# Patient Record
Sex: Female | Born: 1935
Health system: Southern US, Community
[De-identification: ages and names within clinical notes are randomized; demographics above are authoritative.]

## PROBLEM LIST (undated history)

## (undated) DIAGNOSIS — Z558 Other problems related to education and literacy: Secondary | ICD-10-CM

## (undated) DIAGNOSIS — G459 Transient cerebral ischemic attack, unspecified: Secondary | ICD-10-CM

## (undated) DIAGNOSIS — I2089 Other forms of angina pectoris: Secondary | ICD-10-CM

---

## 2019-09-16 ENCOUNTER — Ambulatory Visit (INDEPENDENT_AMBULATORY_CARE_PROVIDER_SITE_OTHER): Payer: Medicare HMO | Admitting: Family Medicine

## 2019-09-16 ENCOUNTER — Encounter: Payer: Self-pay | Admitting: Family Medicine

## 2019-09-16 ENCOUNTER — Other Ambulatory Visit: Payer: Self-pay

## 2019-09-16 DIAGNOSIS — M5136 Other intervertebral disc degeneration, lumbar region: Secondary | ICD-10-CM | POA: Diagnosis not present

## 2019-09-16 MED ORDER — GABAPENTIN 100 MG PO CAPS: 200.0000 mg | ORAL_CAPSULE | Freq: Every day | ORAL | 0 refills | Status: DC

## 2019-09-16 MED ORDER — GABAPENTIN 100 MG PO CAPS: 200.0000 mg | ORAL_CAPSULE | Freq: Every day | ORAL | 0 refills | Status: AC

## 2019-09-16 NOTE — Assessment & Plan Note (Signed)
Degenerative disc disease, patient likely has also some mild radicular symptoms.  Started on gabapentin, home exercises given, patient wants to hold on any type of formal physical therapy secondary to the coronavirus.  We discussed the possibility though of needing advanced imaging and potential injections.  Patient would like to avoid that.  Patient will try the conservative therapy and follow-up with me again in 4 to 8 weeks

## 2019-09-16 NOTE — Progress Notes (Signed)
[name] [place] [place], [place] [address] Phone: [phone] Subjective:   [name], am serving as a scribe for Dr. Hulan Saas.  This visit occurred during the SARS-CoV-2 public health emergency.  Safety protocols were in place, including screening questions prior to the visit, additional usage of staff PPE, and extensive cleaning of exam room while observing appropriate contact time as indicated for disinfecting solutions.    CC: Low back pain  [id number]  Sheila [name] is a 85 y.o. female coming in with complaint of back pain. Patient states that she has been having back pain since last year. Pain radiates down into her right leg. Has pain in the glute, hamstring and quad. Knee also is "achy." History of knee surgery in 2020. History of total hip replacement in 2011. Had Synvisc injection in October but has not had any relief. Has been trying to not take anything for pain.  Patient likes to even avoid over-the-counter medications.    History reviewed. No pertinent past medical history. History reviewed. No pertinent surgical history. Social History   Socioeconomic History  . Marital status: Widowed    Spouse name: Not on file  . Number of children: Not on file  . Years of education: Not on file  . Highest education level: Not on file  Occupational History  . Not on file  Tobacco Use  . Smoking status: Not on file  Substance and Sexual Activity  . Alcohol use: Not on file  . Drug use: Not on file  . Sexual activity: Not on file  Other Topics Concern  . Not on file  Social History Narrative  . Not on file   Social Determinants of Health   Financial Resource Strain:   . Difficulty of Paying Living Expenses: Not on file  Food Insecurity:   . Worried About Charity fundraiser in the Last Year: Not on file  . Ran Out of Food in the Last Year: Not on file  Transportation Needs:   . Lack of Transportation (Medical): Not on  file  . Lack of Transportation (Non-Medical): Not on file  Physical Activity:   . Days of Exercise per Week: Not on file  . Minutes of Exercise per Session: Not on file  Stress:   . Feeling of Stress : Not on file  Social Connections:   . Frequency of Communication with Friends and Family: Not on file  . Frequency of Social Gatherings with Friends and Family: Not on file  . Attends Religious Services: Not on file  . Active Member of Clubs or Organizations: Not on file  . Attends Archivist Meetings: Not on file  . Marital Status: Not on file   Not on File History reviewed. No pertinent family history.      Current Outpatient Medications (Hematological):  .  clopidogrel (PLAVIX) 75 MG tablet, Take 75 mg by mouth daily.  Current Outpatient Medications (Other):  .  gabapentin (NEURONTIN) 100 MG capsule, Take 2 capsules (200 mg total) by mouth at bedtime.    Past medical history, social, surgical and family history all reviewed in electronic medical record.  No pertanent information unless stated regarding to the chief complaint.   Review of Systems:  No headache, visual changes, nausea, vomiting, diarrhea, constipation, dizziness, abdominal pain, skin rash, fevers, chills, night sweats, weight loss, swollen lymph nodes, body aches, joint swelling, muscle aches, chest pain, shortness of breath, mood changes.   Objective  Blood pressure 138/80, pulse 76, height 5\' 6"  (1.676 m), weight 164 lb (74.4 kg), SpO2 97 %.   General: No apparent distress alert and oriented x3 mood and affect normal, dressed appropriately.  HEENT: Pupils equal, extraocular movements intact  Respiratory: Patient's speak in full sentences and does not appear short of breath  Cardiovascular: No lower extremity edema, non tender, no erythema  Skin: Warm dry intact with no signs of infection or rash on extremities or on axial skeleton.  Abdomen: Soft nontender  Neuro: Cranial nerves II through XII  are intact, neurovascularly intact in all extremities with 2+ DTRs and 2+ pulses.  Lymph: No lymphadenopathy of posterior or anterior cervical chain or axillae bilaterally.  Gait antalgic MSK:  tender with full range of motion and good stability and symmetric strength and tone of shoulders, elbows, wrist, hip and ankles bilaterally.  Moderate to severe arthritic changes of multiple joints   Back exam does show the patient does have significant scoliosis with a right sided rotation and left-sided sidebending of the lumbar spine.  Loss of lordosis.  Tender to palpation in the paraspinal musculature on the right side.  Mild increase in tightness with straight leg test on the right side compared to left.  Deep tendon reflexes are intact.  Neurovascular intact distally right greater than left.  Tightness of the FABER test on the right side as well.  [id number]; 15 additional minutes spent for Therapeutic exercises as stated in above notes.  This included exercises focusing on stretching, strengthening, with significant focus on eccentric aspects.   Long term goals include an improvement in range of motion, strength, endurance as well as avoiding reinjury. Patient's frequency would include in 1-2 times a day, 3-5 times a week for a duration of 6-12 weeks. Low back exercises that included:  Pelvic tilt/bracing instruction to focus on control of the pelvic girdle and lower abdominal muscles  Glute strengthening exercises, focusing on proper firing of the glutes without engaging the low back muscles Proper stretching techniques for maximum relief for the hamstrings, hip flexors, low back and some rotation where tolerated    Proper technique shown and discussed handout in great detail with ATC.  All questions were discussed and answered.   Impression and Recommendations:     This case required medical decision making of moderate complexity. The above documentation has been reviewed and is accurate and complete  , DO       Note: This dictation was prepared with Dragon dictation along with smaller phrase technology. Any transcriptional errors that result from this process are unintentional.

## 2019-09-16 NOTE — Patient Instructions (Signed)
Good to see you.  Ice 20 minutes 2 times daily. Usually after activity and before bed. Exercises 3 times a week.  Gabapentin 200 mg at night   Turmeric 500mg  daily  Tart cherry extract 1200mg  at night Vitamin D 2000 IU daily  See me again in 4-6 weeks   [address], [address] [place], [place] [address] Phone [phone]

## 2019-10-15 ENCOUNTER — Ambulatory Visit (INDEPENDENT_AMBULATORY_CARE_PROVIDER_SITE_OTHER): Payer: Medicare HMO

## 2019-10-15 ENCOUNTER — Other Ambulatory Visit: Payer: Self-pay

## 2019-10-15 ENCOUNTER — Ambulatory Visit: Payer: Medicare HMO | Admitting: Family Medicine

## 2019-10-15 ENCOUNTER — Encounter: Payer: Self-pay | Admitting: Family Medicine

## 2019-10-15 VITALS — BP 110/70 | HR 77 | Ht 66.0 in | Wt 163.0 lb

## 2019-10-15 DIAGNOSIS — M25551 Pain in right hip: Secondary | ICD-10-CM | POA: Diagnosis not present

## 2019-10-15 DIAGNOSIS — M5136 Other intervertebral disc degeneration, lumbar region: Secondary | ICD-10-CM

## 2019-10-15 DIAGNOSIS — M7061 Trochanteric bursitis, right hip: Secondary | ICD-10-CM | POA: Diagnosis not present

## 2019-10-15 DIAGNOSIS — G8929 Other chronic pain: Secondary | ICD-10-CM | POA: Diagnosis not present

## 2019-10-15 NOTE — Assessment & Plan Note (Signed)
Patient is making improvement with this.  Attempted doing greater trochanteric injection.  Stayed significantly superficial secondary to patient having a arthroplasty.  Discussed with patient about icing regimen, avoiding direct impact in the area.  I believe the patient's most recent x-rays of the hip seem to be stable but we did discuss the possibility of getting another x-ray to make sure.  Did not feel any instability noted today.  Patient does have severe arthritic changes of the right knee that is likely contributing to some of the discomfort and pain.  Patient will follow up with me again in 4 to 6 weeks.

## 2019-10-15 NOTE — Progress Notes (Signed)
[name] [address] [place] [address] Phone: [phone] Subjective:   I Sheila [name] am serving as a Neurosurgeon for Dr. Antoine Primas.  This visit occurred during the SARS-CoV-2 public health emergency.  Safety protocols were in place, including screening questions prior to the visit, additional usage of staff PPE, and extensive cleaning of exam room while observing appropriate contact time as indicated for disinfecting solutions.    CC: Low back pain and hip pain  [id number]  Sheila [name] is a 86 y.o. female coming in with complaint of back and right hip pain. States she is doing ok today.  Patient states that unfortunately now seems to be more of the right leg pain.  Patient has a history of a right hip replacement.  States that it seems to be on the lateral aspect of the hip.  Very severe.  Wakes her up at night.  Stays localized with some intermittent radiation down the thigh.  No weakness but sometimes the pain is severe when she goes from a sitting to standing position that it takes some time to increase activity.  Once she starts walking it seems to get better.  Does state that the back pain seems to be 80 to 90% better.     No past medical history on file. No past surgical history on file. Social History   Socioeconomic History  . Marital status: Widowed    Spouse name: Not on file  . Number of children: Not on file  . Years of education: Not on file  . Highest education level: Not on file  Occupational History  . Not on file  Tobacco Use  . Smoking status: Not on file  Substance and Sexual Activity  . Alcohol use: Not on file  . Drug use: Not on file  . Sexual activity: Not on file  Other Topics Concern  . Not on file  Social History Narrative  . Not on file   Social Determinants of Health   Financial Resource Strain:   . Difficulty of Paying Living Expenses: Not on file  Food Insecurity:   . Worried About Patent examiner in the Last Year: Not on file  . Ran Out of Food in the Last Year: Not on file  Transportation Needs:   . Lack of Transportation (Medical): Not on file  . Lack of Transportation (Non-Medical): Not on file  Physical Activity:   . Days of Exercise per Week: Not on file  . Minutes of Exercise per Session: Not on file  Stress:   . Feeling of Stress : Not on file  Social Connections:   . Frequency of Communication with Friends and Family: Not on file  . Frequency of Social Gatherings with Friends and Family: Not on file  . Attends Religious Services: Not on file  . Active Member of Clubs or Organizations: Not on file  . Attends Banker Meetings: Not on file  . Marital Status: Not on file   Not on File No family history on file.      Current Outpatient Medications (Hematological):  .  clopidogrel (PLAVIX) 75 MG tablet, Take 75 mg by mouth daily.  Current Outpatient Medications (Other):  .  gabapentin (NEURONTIN) 100 MG capsule, Take 2 capsules (200 mg total) by mouth at bedtime.    Past medical history, social, surgical and family history all reviewed in electronic medical record.  No pertanent information unless stated regarding to the chief  complaint.   Review of Systems:  No headache, visual changes, nausea, vomiting, diarrhea, constipation, dizziness, abdominal pain, skin rash, fevers, chills, night sweats, weight loss, swollen lymph nodes,  chest pain, shortness of breath, mood changes.  Positive muscle aches, body aches,  Objective  Blood pressure 110/70, pulse 77, height 5\' 6"  (1.676 m), weight 163 lb (73.9 kg), SpO2 97 %.    General: No apparent distress alert and oriented x3 mood and affect normal, dressed appropriately.  HEENT: Pupils equal, extraocular movements intact  Respiratory: Patient's speak in full sentences and does not appear short of breath  Cardiovascular: Trace lower extremity edema, tender, no erythema  Skin: Warm dry intact with  no signs of infection or rash on extremities or on axial skeleton.   Gait antalgic favoring right leg MSK: Arthritic changes of multiple joints  Right hip exam does not have any instability.  Patient on inspection incision from previous surgery is well-healed.  Patient is severely tender to palpation over the lateral greater trochanteric area.  Unable to do Lawrence secondary to pain and tightness.  Negative straight leg test but still has tightness of the hamstring.  After verbal consent patient was prepped with alcohol swab and with a 21-gauge 2 inch needle injected with 1 cc of 0.5% Marcaine and 1 cc of Kenalog 40 mg/mL into the right greater trochanteric area.  No blood loss.  Stayed superficial.  Band-Aid placed.  Postinjection instructions given    Impression and Recommendations:     This case required medical decision making of moderate complexity. The above documentation has been reviewed and is accurate and complete Lyndal Pulley, DO       Note: This dictation was prepared with Dragon dictation along with smaller phrase technology. Any transcriptional errors that result from this process are unintentional.

## 2019-10-15 NOTE — Patient Instructions (Addendum)
Xray today Don't do the exercise that hurts Continue everything else See me again in 4-6 weeks

## 2019-11-12 ENCOUNTER — Other Ambulatory Visit: Payer: Self-pay

## 2019-11-12 ENCOUNTER — Ambulatory Visit (INDEPENDENT_AMBULATORY_CARE_PROVIDER_SITE_OTHER): Payer: Medicare HMO | Admitting: Family Medicine

## 2019-11-12 ENCOUNTER — Encounter: Payer: Self-pay | Admitting: Family Medicine

## 2019-11-12 DIAGNOSIS — M5136 Other intervertebral disc degeneration, lumbar region: Secondary | ICD-10-CM | POA: Diagnosis not present

## 2019-11-12 DIAGNOSIS — M7061 Trochanteric bursitis, right hip: Secondary | ICD-10-CM

## 2019-11-12 NOTE — Assessment & Plan Note (Signed)
Significant improvement at this time.  We will have patient follow-up again in 6 weeks.  Continue the gabapentin.  If doing well in 6 weeks follow-up as needed

## 2019-11-12 NOTE — Progress Notes (Signed)
[place] Phone: [phone] Subjective:   [name], am serving as a scribe for Dr. Hulan Saas. This visit occurred during the SARS-CoV-2 public health emergency.  Safety protocols were in place, including screening questions prior to the visit, additional usage of staff PPE, and extensive cleaning of exam room while observing appropriate contact time as indicated for disinfecting solutions.   I'm seeing this patient by the request  of:  System, Pcp Not In  CC: Hip and low back follow-up  [id number]   10/12/2019 Patient is making improvement with this.  Attempted doing greater trochanteric injection.  Stayed significantly superficial secondary to patient having a arthroplasty.  Discussed with patient about icing regimen, avoiding direct impact in the area.  I believe the patient's most recent x-rays of the hip seem to be stable but we did discuss the possibility of getting another x-ray to make sure.  Did not feel any instability noted today.  Patient does have severe arthritic changes of the right knee that is likely contributing to some of the discomfort and pain.  Patient will follow up with me again in 4 to 6 weeks.  Updat 11/12/2019 Sheila [name] is a 86 y.o. female coming in with complaint of right hip pain. Patient states that her pain subsided since the injection. Has been having slight lower back pain but is better as well.  Patient states that the gabapentin has helped her sleep and not having as much radicular symptoms either.  Feels like she has made significant progress at the moment.  Patient cannot believe how much this did help.     No past medical history on file. No past surgical history on file. Social History   Socioeconomic History  . Marital status: Widowed    Spouse name: Not on file  . Number of children: Not on file  . Years of education: Not on file  . Highest education level: Not  on file  Occupational History  . Not on file  Tobacco Use  . Smoking status: Not on file  Substance and Sexual Activity  . Alcohol use: Not on file  . Drug use: Not on file  . Sexual activity: Not on file  Other Topics Concern  . Not on file  Social History Narrative  . Not on file   Social Determinants of Health   Financial Resource Strain:   . Difficulty of Paying Living Expenses: Not on file  Food Insecurity:   . Worried About Charity fundraiser in the Last Year: Not on file  . Ran Out of Food in the Last Year: Not on file  Transportation Needs:   . Lack of Transportation (Medical): Not on file  . Lack of Transportation (Non-Medical): Not on file  Physical Activity:   . Days of Exercise per Week: Not on file  . Minutes of Exercise per Session: Not on file  Stress:   . Feeling of Stress : Not on file  Social Connections:   . Frequency of Communication with Friends and Family: Not on file  . Frequency of Social Gatherings with Friends and Family: Not on file  . Attends Religious Services: Not on file  . Active Member of Clubs or Organizations: Not on file  . Attends Archivist Meetings: Not on file  . Marital Status: Not on file   Not on File No family history on file.      Current Outpatient  Medications (Hematological):  .  clopidogrel (PLAVIX) 75 MG tablet, Take 75 mg by mouth daily.  Current Outpatient Medications (Other):  .  gabapentin (NEURONTIN) 100 MG capsule, Take 2 capsules (200 mg total) by mouth at bedtime.   Reviewed prior external information including notes and imaging from  primary care provider As well as notes that were available from care everywhere and other healthcare systems.  Past medical history, social, surgical and family history all reviewed in electronic medical record.  No pertanent information unless stated regarding to the chief complaint.   Review of Systems:  No headache, visual changes, nausea, vomiting, diarrhea,  constipation, dizziness, abdominal pain, skin rash, fevers, chills, night sweats, weight loss, swollen lymph nodes, body aches, joint swelling, chest pain, shortness of breath, mood changes. POSITIVE muscle aches  Objective  Blood pressure 114/82, pulse 74, height 5\' 6"  (1.676 m), SpO2 98 %.   General: No apparent distress alert and oriented x3 mood and affect normal, dressed appropriately.  HEENT: Pupils equal, extraocular movements intact  Respiratory: Patient's speak in full sentences and does not appear short of breath  Cardiovascular: Trace lower extremity edema, non tender, no erythema  Skin: Warm dry intact with no signs of infection or rash on extremities or on axial skeleton.  Abdomen: Soft nontender  Neuro: Cranial nerves II through XII are intact, neurovascularly intact in all extremities with 2+ DTRs and 2+ pulses.  Lymph: No lymphadenopathy of posterior or anterior cervical chain or axillae bilaterally.  Gait antalgic walk with the aid of a cane MSK: Right hip no longer tender to palpation over the greater trochanteric area.  Still have the loss of lordosis and degenerative scoliosis of the lumbar spine significant arthritic changes of multiple joints    Impression and Recommendations:      The above documentation has been reviewed and is accurate and complete , DO       Note: This dictation was prepared with Dragon dictation along with smaller phrase technology. Any transcriptional errors that result from this process are unintentional.

## 2019-11-12 NOTE — Assessment & Plan Note (Signed)
Continue gabapentin, no need for further imaging at this time

## 2019-11-12 NOTE — Patient Instructions (Signed)
See me in 6 weeks Can do another injection on hip if needed

## 2019-12-24 ENCOUNTER — Other Ambulatory Visit: Payer: Self-pay

## 2019-12-24 ENCOUNTER — Encounter: Payer: Self-pay | Admitting: Family Medicine

## 2019-12-24 ENCOUNTER — Ambulatory Visit: Payer: Medicare HMO | Admitting: Family Medicine

## 2019-12-24 VITALS — BP 122/82 | HR 76 | Ht 66.0 in | Wt 163.0 lb

## 2019-12-24 DIAGNOSIS — M2550 Pain in unspecified joint: Secondary | ICD-10-CM

## 2019-12-24 DIAGNOSIS — M5136 Other intervertebral disc degeneration, lumbar region: Secondary | ICD-10-CM

## 2019-12-24 MED ORDER — KETOROLAC TROMETHAMINE 30 MG/ML IJ SOLN
30.0000 mg | Freq: Once | INTRAMUSCULAR | Status: AC
  Administered 2019-12-24: 30 mg via INTRAMUSCULAR

## 2019-12-24 MED ORDER — METHYLPREDNISOLONE ACETATE 40 MG/ML IJ SUSP
40.0000 mg | Freq: Once | INTRAMUSCULAR | Status: AC
  Administered 2019-12-24: 40 mg via INTRAMUSCULAR

## 2019-12-24 NOTE — Patient Instructions (Addendum)
See me in 6 weeks

## 2019-12-24 NOTE — Assessment & Plan Note (Signed)
Known severe degenerative disc disease with some lumbar radiculopathy.  Toradol and Depo-Medrol given today that I think will be beneficial for some of the aches and pains.  Patient will follow up with her pain management provider to try to get an epidural which patient has responded to.  We will continue to see patient for the possibility of the greater trochanteric.  Social determinants of health include difficulty with transportation and can only come when patient has transportation provided for her.  Chronic problem mild exacerbation

## 2019-12-24 NOTE — Progress Notes (Signed)
[place] Phone: [phone] Subjective:   Sheila [name], am serving as a scribe for Dr. Hulan Saas. This visit occurred during the SARS-CoV-2 public health emergency.  Safety protocols were in place, including screening questions prior to the visit, additional usage of staff PPE, and extensive cleaning of exam room while observing appropriate contact time as indicated for disinfecting solutions.    I'm seeing this patient by the request  of:  System, Pcp Not In  CC: Back pain and hip pain follow-up  [id number]   11/12/2019 Significant improvement at this time.  We will have patient follow-up again in 6 weeks.  Continue the gabapentin.  If doing well in 6 weeks follow-up as needed  Continue gabapentin, [name] need for further imaging at this time  Update 12/24/2019 Sheila [name] is a 86 y.o. female coming in with complaint of right hip and back pain. Patient states that her back pain increased when she went off of gabapentin. Patient had epidural September 2020. Radiculopathy coming back in right leg.  Patient has known degenerative disc disease.  Patient feels like she needs an epidural done.  Feels like the hip is actually doing relatively well at the moment.  Does not feel like she needs an injection at the moment.      [name] past medical history on file. [name] past surgical history on file. Social History   Socioeconomic History  . Marital status: Widowed    Spouse name: Not on file  . Number of children: Not on file  . Years of education: Not on file  . Highest education level: Not on file  Occupational History  . Not on file  Tobacco Use  . Smoking status: Not on file  Substance and Sexual Activity  . Alcohol use: Not on file  . Drug use: Not on file  . Sexual activity: Not on file  Other Topics Concern  . Not on file  Social History Narrative  . Not on file   Social Determinants of Health    Financial Resource Strain:   . Difficulty of Paying Living Expenses:   Food Insecurity:   . Worried About Charity fundraiser in the Last Year:   . Arboriculturist in the Last Year:   Transportation Needs:   . Film/video editor (Medical):   Marland Kitchen Lack of Transportation (Non-Medical):   Physical Activity:   . Days of Exercise per Week:   . Minutes of Exercise per Session:   Stress:   . Feeling of Stress :   Social Connections:   . Frequency of Communication with Friends and Family:   . Frequency of Social Gatherings with Friends and Family:   . Attends Religious Services:   . Active Member of Clubs or Organizations:   . Attends Archivist Meetings:   Marland Kitchen Marital Status:    Allergies  Allergen Reactions  . Latex   . Penicillins Other (See Comments)  . Statins   . Clindamycin Rash  . Other Dermatitis and Other (See Comments)        [name] family history on file.      Current Outpatient Medications (Hematological):  .  clopidogrel (PLAVIX) 75 MG tablet, Take 75 mg by mouth daily.  Current Outpatient Medications (Other):  .  gabapentin (NEURONTIN) 100 MG capsule, Take 2 capsules (200 mg total) by mouth at bedtime.   Reviewed prior external information including notes and imaging  from  primary care provider As well as notes that were available from care everywhere and other healthcare systems.  Past medical history, social, surgical and family history all reviewed in electronic medical record.  [name] pertanent information unless stated regarding to the chief complaint.   Review of Systems:  [name] headache, visual changes, nausea, vomiting, diarrhea, constipation, dizziness, abdominal pain, skin rash, fevers, chills, night sweats, weight loss, swollen lymph nodes, body aches, joint swelling, chest pain, shortness of breath, mood changes. POSITIVE muscle aches  Objective  Blood pressure 122/82, pulse 76, height 5\' 6"  (1.676 m), weight 163 lb (73.9 kg), SpO2 98 %.    General: [name] apparent distress alert and oriented x3 mood and affect normal, dressed appropriately.  HEENT: Pupils equal, extraocular movements intact  Respiratory: Patient's speak in full sentences and does not appear short of breath  Cardiovascular: Trace lower extremity edema, non tender, [name] erythema  Gait antalgic walking with the aid of a cane MSK:   Low back exam does show some degenerative scoliosis noted.  Tender to palpation of the paraspinal musculature lumbar spine right greater than left.  Patient has significant tightness with straight leg test bilaterally left greater than right.  Patient does still have some tenderness over the greater trochanteric area.   Impression and Recommendations:     The above documentation has been reviewed and is accurate and complete , DO       Note: This dictation was prepared with Dragon dictation along with smaller phrase technology. Any transcriptional errors that result from this process are unintentional.

## 2020-02-04 ENCOUNTER — Ambulatory Visit: Payer: Medicare HMO | Admitting: Family Medicine

## 2020-02-04 ENCOUNTER — Other Ambulatory Visit: Payer: Self-pay

## 2020-02-04 ENCOUNTER — Encounter: Payer: Self-pay | Admitting: Family Medicine

## 2020-02-04 VITALS — BP 116/70 | HR 77 | Ht 66.0 in | Wt 155.0 lb

## 2020-02-04 DIAGNOSIS — M47818 Spondylosis without myelopathy or radiculopathy, sacral and sacrococcygeal region: Secondary | ICD-10-CM

## 2020-02-04 DIAGNOSIS — M7061 Trochanteric bursitis, right hip: Secondary | ICD-10-CM

## 2020-02-04 DIAGNOSIS — M5136 Other intervertebral disc degeneration, lumbar region: Secondary | ICD-10-CM | POA: Diagnosis not present

## 2020-02-04 NOTE — Patient Instructions (Signed)
Injected

## 2020-02-04 NOTE — Assessment & Plan Note (Signed)
Patient's continues to have chronic pain.  Seeing a pain management doctor for epidurals in the back and patient's degenerative disc disease.  Encourage patient to continue the gabapentin.  I attempted a sacroiliac injection today to see if this will get any benefit in helping [place] diagnostically and potentially therapeutically then.  Patient's best success so far has been a greater trochanteric injection and will consider this.  Follow-up again at that time which will be in 6 to 8 weeks

## 2020-02-04 NOTE — Progress Notes (Signed)
[name] [address] [place] [address] Phone: [phone] Subjective:   Sheila [name], am serving as a scribe for Dr. Antoine Primas. This visit occurred during the SARS-CoV-2 public health emergency.  Safety protocols were in place, including screening questions prior to the visit, additional usage of staff PPE, and extensive cleaning of exam room while observing appropriate contact time as indicated for disinfecting solutions.   I'm seeing this patient by the request  of:  System, Pcp Not In  CC: Back pain follow-up  GNO:[id number]   12/24/2019 Known severe degenerative disc disease with some lumbar radiculopathy.  Toradol and Depo-Medrol given today that I think will be beneficial for some of the aches and pains.  Patient will follow up with her pain management provider to try to get an epidural which patient has responded to.  We will continue to see patient for the possibility of the greater trochanteric.  Social determinants of health include difficulty with transportation and can only come when patient has transportation provided for her.  Chronic problem mild exacerbation  Update 02/04/2020 Angellina Ferdinand is a 86 y.o. female coming in with complaint of right hip pain. Pain with movement. Using IBU prn for pain. Denies any radiating symptoms.  Patient states the pain is more the posterior aspect of the back that is contributing to more the problem.  Has responded well to a greater trochanteric injection previously.  Patient was to get an epidural since we have seen patient but was unable to do so secondary to getting her Covid injection.     No past medical history on file. No past surgical history on file. Social History   Socioeconomic History  . Marital status: Widowed    Spouse name: Not on file  . Number of children: Not on file  . Years of education: Not on file  . Highest education level: Not on file  Occupational History  . Not on  file  Tobacco Use  . Smoking status: Not on file  Substance and Sexual Activity  . Alcohol use: Not on file  . Drug use: Not on file  . Sexual activity: Not on file  Other Topics Concern  . Not on file  Social History Narrative  . Not on file   Social Determinants of Health   Financial Resource Strain:   . Difficulty of Paying Living Expenses:   Food Insecurity:   . Worried About Programme researcher, broadcasting/film/video in the Last Year:   . Barista in the Last Year:   Transportation Needs:   . Freight forwarder (Medical):   Marland Kitchen Lack of Transportation (Non-Medical):   Physical Activity:   . Days of Exercise per Week:   . Minutes of Exercise per Session:   Stress:   . Feeling of Stress :   Social Connections:   . Frequency of Communication with Friends and Family:   . Frequency of Social Gatherings with Friends and Family:   . Attends Religious Services:   . Active Member of Clubs or Organizations:   . Attends Banker Meetings:   Marland Kitchen Marital Status:    Allergies  Allergen Reactions  . Latex   . Penicillins Other (See Comments)  . Statins   . Clindamycin Rash  . Other Dermatitis and Other (See Comments)        No family history on file.      Current Outpatient Medications (Hematological):  .  clopidogrel (PLAVIX)  75 MG tablet, Take 75 mg by mouth daily.  Current Outpatient Medications (Other):  .  gabapentin (NEURONTIN) 100 MG capsule, Take 2 capsules (200 mg total) by mouth at bedtime.   Reviewed prior external information including notes and imaging from  primary care provider As well as notes that were available from care everywhere and other healthcare systems.  Past medical history, social, surgical and family history all reviewed in electronic medical record.  No pertanent information unless stated regarding to the chief complaint.   Review of Systems:  No headache, visual changes, nausea, vomiting, diarrhea, constipation, dizziness, abdominal  pain, skin rash, fevers, chills, night sweats, weight loss, swollen lymph nodes, body aches, joint swelling, chest pain, shortness of breath, mood changes. POSITIVE muscle aches  Objective  Blood pressure 116/70, pulse 77, height 5\' 6"  (1.676 m), weight 155 lb (70.3 kg), SpO2 97 %.   General: No apparent distress alert and oriented x3 mood and affect normal, dressed appropriately.  HEENT: Pupils equal, extraocular movements intact  Respiratory: Patient's speak in full sentences and does not appear short of breath   antalgic gait noted. Patient does have severe kyphosis of the upper back but patient does have scoliosis of the thoracic or lumbar in the lumbosacral area.  Severe tenderness to palpation over the sacroiliac joint.  Continued tenderness over the greater trochanteric area on the right hip.  Patient does have tightness with straight leg test.  After verbal consent patient was prepped with alcohol swab and with a 21-gauge 2 inch needle injected into the right sacroiliac joint.  A total of 0.5 cc of 0.5% Marcaine and 1 cc of Kenalog 40 mg/mL used, no blood loss.  Band-Aid placed.  Postinjection instructions given    Impression and Recommendations:     This case required medical decision making of moderate complexity. The above documentation has been reviewed and is accurate and complete Lyndal Pulley, DO       Note: This dictation was prepared with Dragon dictation along with smaller phrase technology. Any transcriptional errors that result from this process are unintentional.

## 2020-03-17 ENCOUNTER — Ambulatory Visit (INDEPENDENT_AMBULATORY_CARE_PROVIDER_SITE_OTHER): Payer: Medicare HMO | Admitting: Family Medicine

## 2020-03-17 ENCOUNTER — Encounter: Payer: Self-pay | Admitting: Family Medicine

## 2020-03-17 ENCOUNTER — Other Ambulatory Visit: Payer: Self-pay

## 2020-03-17 DIAGNOSIS — M5136 Other intervertebral disc degeneration, lumbar region: Secondary | ICD-10-CM

## 2020-03-17 DIAGNOSIS — M47818 Spondylosis without myelopathy or radiculopathy, sacral and sacrococcygeal region: Secondary | ICD-10-CM | POA: Diagnosis not present

## 2020-03-17 DIAGNOSIS — M7061 Trochanteric bursitis, right hip: Secondary | ICD-10-CM | POA: Diagnosis not present

## 2020-03-17 NOTE — Assessment & Plan Note (Signed)
Chronic as well.  Not responding as well to the gabapentin secondary to age and other comorbidities care to unfortunately increase too much of medications.  Patient has been getting epidurals from a pain management center and will consider to have that done again if necessary.

## 2020-03-17 NOTE — Progress Notes (Signed)
[facility] [place] Phone: [phone] Subjective:   Sheila Sheila [name], am serving as a scribe for Dr. Hulan Saas. This visit occurred during the SARS-CoV-2 public health emergency.  Safety protocols were in place, including screening questions prior to the visit, additional usage of staff PPE, and extensive cleaning of exam room while observing appropriate contact time as indicated for disinfecting solutions.   I'm seeing this patient by the request  of:  System, Pcp Not In  CC: Back pain, hip pain follow-up  RDE:[id number]   02/04/2020 Patient's continues to have chronic pain.  Seeing a pain management doctor for epidurals in the back and patient's degenerative disc disease.  Encourage patient to continue the gabapentin.  I attempted a sacroiliac injection today to see if this will get any benefit in helping [place] diagnostically and potentially therapeutically then.  Patient's best success so far has been a greater trochanteric injection and will consider this.  Follow-up again at that time which will be in 6 to 8 weeks  Update 03/17/2020 Sheila Sheila [name] is a 86 y.o. female coming in with complaint of right hip and lower back pain. Back pain has improved but her right hip pain has started to develop pain over GT. Using gabapentin 200mg .  Patient did respond very well to a sacroiliac joint injection previously.  Pain is now more on the lateral aspect of the hip.  Still walking with the aid of a cane.     Sheila [name] past medical history on file. Sheila [name] past surgical history on file. Social History   Socioeconomic History  . Marital status: Widowed    Spouse name: Not on file  . Number of children: Not on file  . Years of education: Not on file  . Highest education level: Not on file  Occupational History  . Not on file  Tobacco Use  . Smoking status: Not on file  Substance and Sexual Activity  . Alcohol use: Not on file  . Drug use: Not  on file  . Sexual activity: Not on file  Other Topics Concern  . Not on file  Social History Narrative  . Not on file   Social Determinants of Health   Financial Resource Strain:   . Difficulty of Paying Living Expenses:   Food Insecurity:   . Worried About Charity fundraiser in the Last Year:   . Arboriculturist in the Last Year:   Transportation Needs:   . Film/video editor (Medical):   Marland Kitchen Lack of Transportation (Non-Medical):   Physical Activity:   . Days of Exercise per Week:   . Minutes of Exercise per Session:   Stress:   . Feeling of Stress :   Social Connections:   . Frequency of Communication with Friends and Family:   . Frequency of Social Gatherings with Friends and Family:   . Attends Religious Services:   . Active Member of Clubs or Organizations:   . Attends Archivist Meetings:   Marland Kitchen Marital Status:    Allergies  Allergen Reactions  . Latex   . Penicillins Other (See Comments)  . Statins   . Clindamycin Rash  . Other Dermatitis and Other (See Comments)        Sheila [name] family history on file.      Current Outpatient Medications (Hematological):  .  clopidogrel (PLAVIX) 75 MG tablet, Take 75 mg by mouth daily.  Current Outpatient Medications (Other):  .  gabapentin (NEURONTIN) 100 MG capsule, Take 2 capsules (200 mg total) by mouth at bedtime.   Reviewed prior external information including notes and imaging from  primary care provider As well as notes that were available from care everywhere and other healthcare systems.  Past medical history, social, surgical and family history all reviewed in electronic medical record.  Sheila [name] pertanent information unless stated regarding to the chief complaint.   Review of Systems:  Sheila [name] headache, visual changes, nausea, vomiting, diarrhea, constipation, dizziness, abdominal pain, skin rash, fevers, chills, night sweats, weight loss, swollen lymph nodes, body aches, joint swelling, chest pain, shortness of  breath, mood changes. POSITIVE muscle aches  Objective  Blood pressure 112/70, pulse 78, height 5\' 6"  (1.676 m), SpO2 98 %.   General: Sheila [name] apparent distress alert and oriented x3 mood and affect normal, dressed appropriately.  HEENT: Pupils equal, extraocular movements intact  Respiratory: Patient's speak in full sentences and does not appear short of breath  Cardiovascular: Sheila [name] lower extremity edema, non tender, Sheila [name] erythema  Neuro: Cranial nerves II through XII are intact, neurovascularly intact in all extremities with 2+ DTRs and 2+ pulses.  Gait antalgic with a cane Back exam shows severe scoliosis noted.  Back exam diffuse tenderness in the paraspinal musculature of the lumbar spine.  Atrophy of the musculature of the legs bilaterally.  4-5 strength of the lower extremities.  Severely tender over the right greater trochanteric area.   After verbal consent patient was prepped with alcohol swab and with a 21-gauge 2 inch needle injected into the right greater trochanteric area with a total of 2 cc of 0.5% Marcaine and 1 cc of Kenalog 40 mg/mL.  Sheila [name] blood loss.  Band-Aid placed.  Postinjection instructions given   Impression and Recommendations:     The above documentation has been reviewed and is accurate and complete , DO       Note: This dictation was prepared with Dragon dictation along with smaller phrase technology. Any transcriptional errors that result from this process are unintentional.

## 2020-03-17 NOTE — Assessment & Plan Note (Signed)
Repeat injection given today, tolerated the procedure well, discussed icing regimen and home exercise, which activities to do which wants to avoid.  Patient hopefully will respond somewhat to the injection and possibly consider the possibility of repeating the right sacroiliac joint injection at next follow-up in 6 to 8 weeks

## 2020-03-17 NOTE — Patient Instructions (Signed)
See me in 6-7 weeks 

## 2020-03-17 NOTE — Assessment & Plan Note (Signed)
Chronic problem.  We will need to monitor.  If this does make improvement we will continue with injections.

## 2020-04-28 ENCOUNTER — Ambulatory Visit: Payer: Medicare HMO | Admitting: Family Medicine

## 2020-04-28 ENCOUNTER — Other Ambulatory Visit: Payer: Self-pay

## 2020-04-28 ENCOUNTER — Encounter: Payer: Self-pay | Admitting: Family Medicine

## 2020-04-28 VITALS — BP 130/80 | HR 101 | Ht 66.0 in | Wt 150.0 lb

## 2020-04-28 DIAGNOSIS — M47818 Spondylosis without myelopathy or radiculopathy, sacral and sacrococcygeal region: Secondary | ICD-10-CM | POA: Diagnosis not present

## 2020-04-28 DIAGNOSIS — M5136 Other intervertebral disc degeneration, lumbar region: Secondary | ICD-10-CM

## 2020-04-28 MED ORDER — GABAPENTIN 100 MG PO CAPS: 200.0000 mg | ORAL_CAPSULE | Freq: Every day | ORAL | 0 refills | Status: DC

## 2020-04-28 NOTE — Progress Notes (Signed)
[name] [address] [place] [address] Phone: [phone] Subjective:   I [name] am serving as a Neurosurgeon for Dr. Antoine Primas.  This visit occurred during the SARS-CoV-2 public health emergency.  Safety protocols were in place, including screening questions prior to the visit, additional usage of staff PPE, and extensive cleaning of exam room while observing appropriate contact time as indicated for disinfecting solutions.   I'm seeing this patient by the request  of:  System, Pcp Not In  CC: Right hip pain, back pain CBS:[id number]   03/17/2020 Repeat injection given today, tolerated the procedure well, discussed icing regimen and home exercise, which activities to do which wants to avoid.  Patient hopefully will respond somewhat to the injection and possibly consider the possibility of repeating the right sacroiliac joint injection at next follow-up in 6 to 8 weeks  04/28/2020 Sheila [name] is a 86 y.o. female coming in with complaint of right hip pain. Patient states she is having pain in her hip. Believes she may need an injection. Would like a refill of gabapentin.  Patient has responded previously.  Is been very active.  Describes the pain as a dull, throbbing aching sensation.  Patient states some very mild radiation down the line but not as severe as what it has been in the past but is starting to get much worse than where she has been recently     No past medical history on file. No past surgical history on file. Social History   Socioeconomic History  . Marital status: Widowed    Spouse name: Not on file  . Number of children: Not on file  . Years of education: Not on file  . Highest education level: Not on file  Occupational History  . Not on file  Tobacco Use  . Smoking status: Not on file  Substance and Sexual Activity  . Alcohol use: Not on file  . Drug use: Not on file  . Sexual activity: Not on file  Other Topics  Concern  . Not on file  Social History Narrative  . Not on file   Social Determinants of Health   Financial Resource Strain:   . Difficulty of Paying Living Expenses:   Food Insecurity:   . Worried About Programme researcher, broadcasting/film/video in the Last Year:   . Barista in the Last Year:   Transportation Needs:   . Freight forwarder (Medical):   Marland Kitchen Lack of Transportation (Non-Medical):   Physical Activity:   . Days of Exercise per Week:   . Minutes of Exercise per Session:   Stress:   . Feeling of Stress :   Social Connections:   . Frequency of Communication with Friends and Family:   . Frequency of Social Gatherings with Friends and Family:   . Attends Religious Services:   . Active Member of Clubs or Organizations:   . Attends Banker Meetings:   Marland Kitchen Marital Status:    Allergies  Allergen Reactions  . Latex   . Penicillins Other (See Comments)  . Statins   . Clindamycin Rash  . Other Dermatitis and Other (See Comments)        No family history on file.      Current Outpatient Medications (Hematological):  .  clopidogrel (PLAVIX) 75 MG tablet, Take 75 mg by mouth daily.  Current Outpatient Medications (Other):  .  gabapentin (NEURONTIN) 100 MG capsule, Take 2 capsules (200  mg total) by mouth at bedtime. .  gabapentin (NEURONTIN) 100 MG capsule, Take 2 capsules (200 mg total) by mouth at bedtime.   Reviewed prior external information including notes and imaging from  primary care provider As well as notes that were available from care everywhere and other healthcare systems.  Past medical history, social, surgical and family history all reviewed in electronic medical record.  No pertanent information unless stated regarding to the chief complaint.   Review of Systems:  No headache, visual changes, nausea, vomiting, diarrhea, constipation, dizziness, abdominal pain, skin rash, fevers, chills, night sweats, weight loss, swollen lymph nodes, body aches,  joint swelling, chest pain, shortness of breath, mood changes. POSITIVE muscle aches  Objective  Blood pressure (!) 130/80, pulse 101, height 5\' 6"  (1.676 m), weight 150 lb (68 kg), SpO2 94 %.   General: No apparent distress alert and oriented x3 mood and affect normal, dressed appropriately.  HEENT: Pupils equal, extraocular movements intact  Respiratory: Patient's speak in full sentences and does not appear short of breath  Gait antalgic not using a cane Patient back does have significant kyphosis of the upper thoracic as well as lumbar lordosis with degenerative scoliosis of the lumbar difficulty to do range of motion secondary to the amount of pain patient is having.  Severe more pain over the right sacroiliac joint  After verbal consent patient was prepped with alcohol swab and with a 22-gauge 3 inch needle injected to the right sacroiliac joint with a total of 1 cc of 0.5% Marcaine and 1 cc of Kenalog 40 mg/mL.  Patient tolerated the procedure well.  No blood loss.  Band-Aid placed.  Postinjection instructions given   Impression and Recommendations:     The above documentation has been reviewed and is accurate and complete , DO       Note: This dictation was prepared with Dragon dictation along with smaller phrase technology. Any transcriptional errors that result from this process are unintentional.

## 2020-04-28 NOTE — Assessment & Plan Note (Addendum)
Chronic problem with exacerbation.  Repeat injection given today, tolerated the procedure well, discussed icing regimen and home exercise, which activities to do which wants to avoid.  Increase activity slowly.  Discussed icing regimen.  Follow-up again in  6 weeks.  Patient is responding well to the gabapentin that is helping with some of the radicular symptoms due to patient's other comorbidities and being on anticoagulation not a candidate for epidurals at the moment.  Follow-up again for 8 weeks

## 2020-04-28 NOTE — Patient Instructions (Signed)
See me again in 6-8 weeks ?

## 2020-06-05 IMAGING — DX DG HIP (WITH OR WITHOUT PELVIS) 2-3V*R*
3 series · 3 of 3 positions shown · non-contrast
Comparison: 11/30/2018

CLINICAL DATA: 86-year-old female with a history of hip pain

EXAM:
DG HIP (WITH OR WITHOUT PELVIS) 2-3V RIGHT

[pelvis ap]
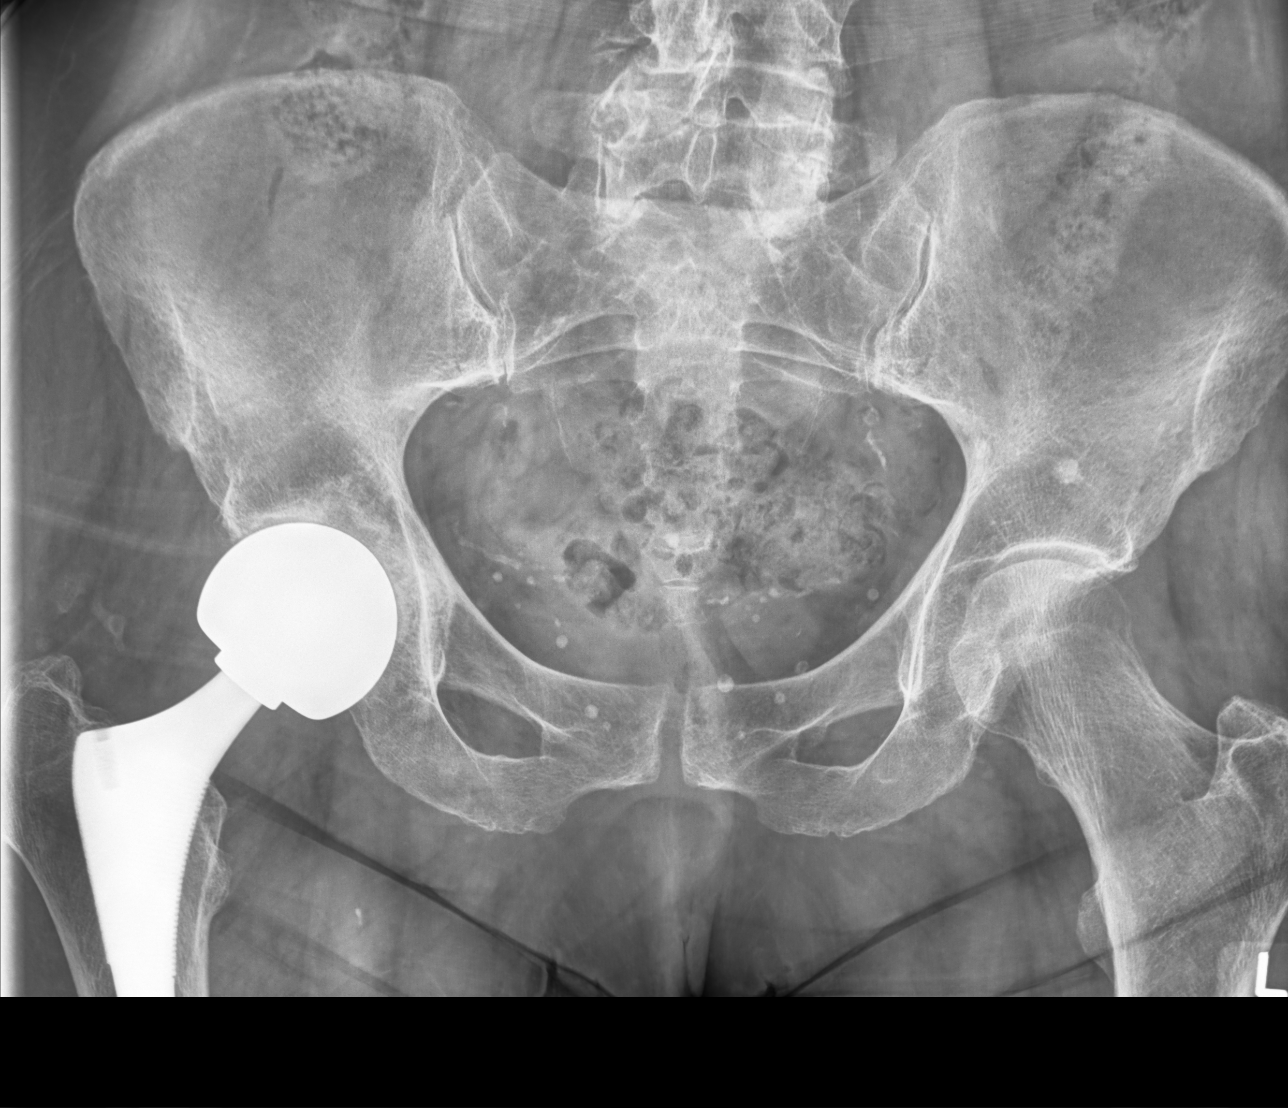

[hip ap]
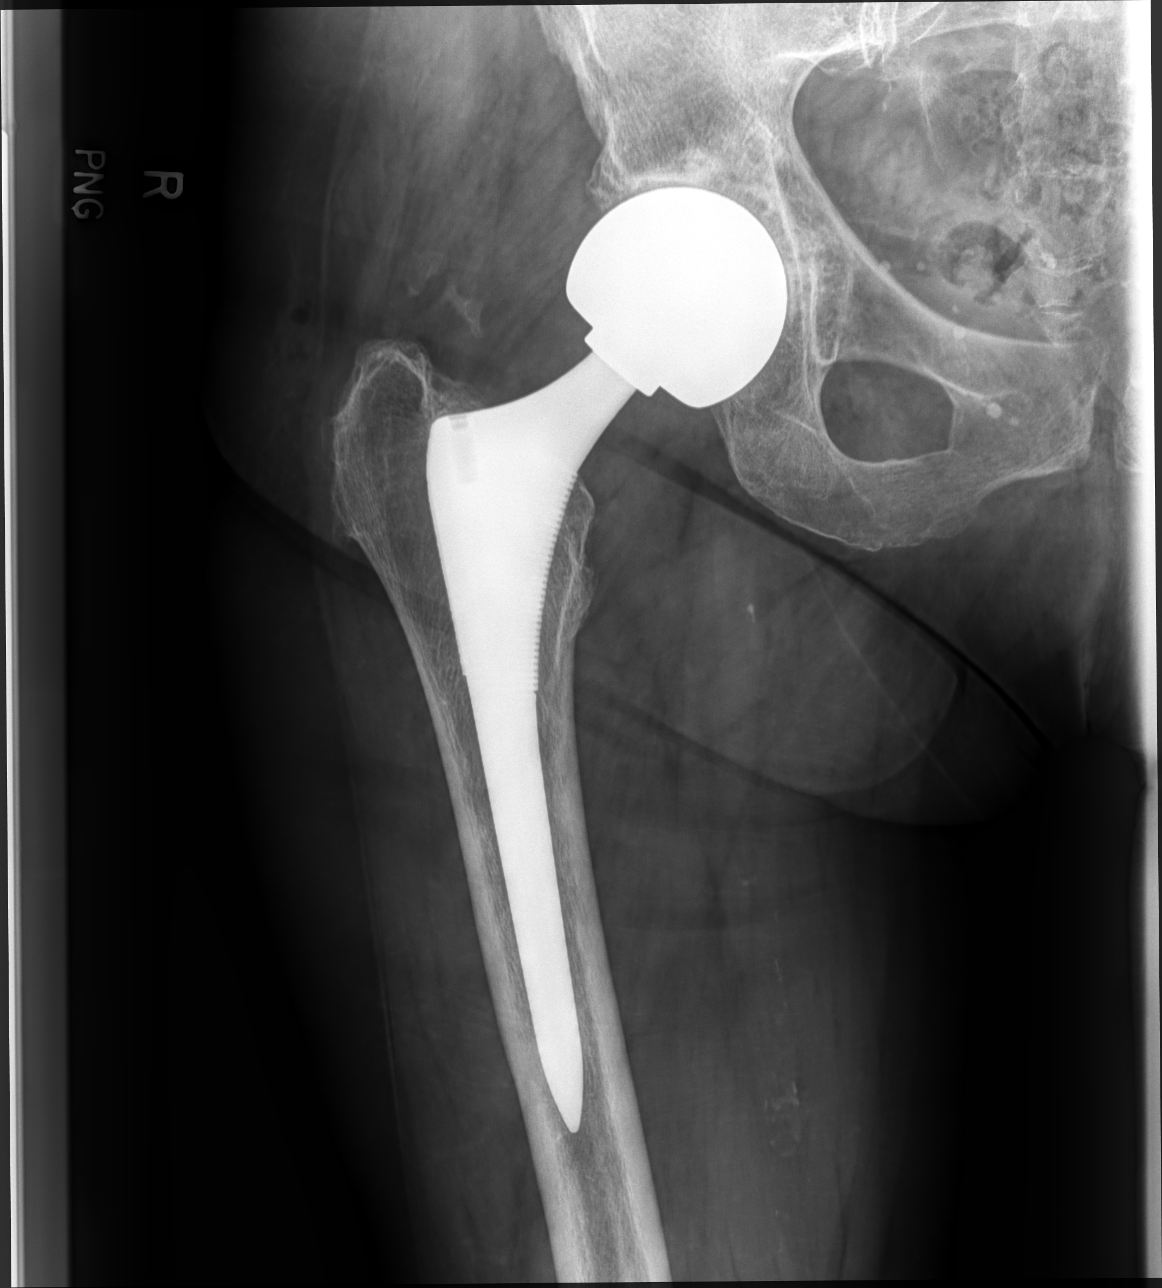

[hip (frog leg)]
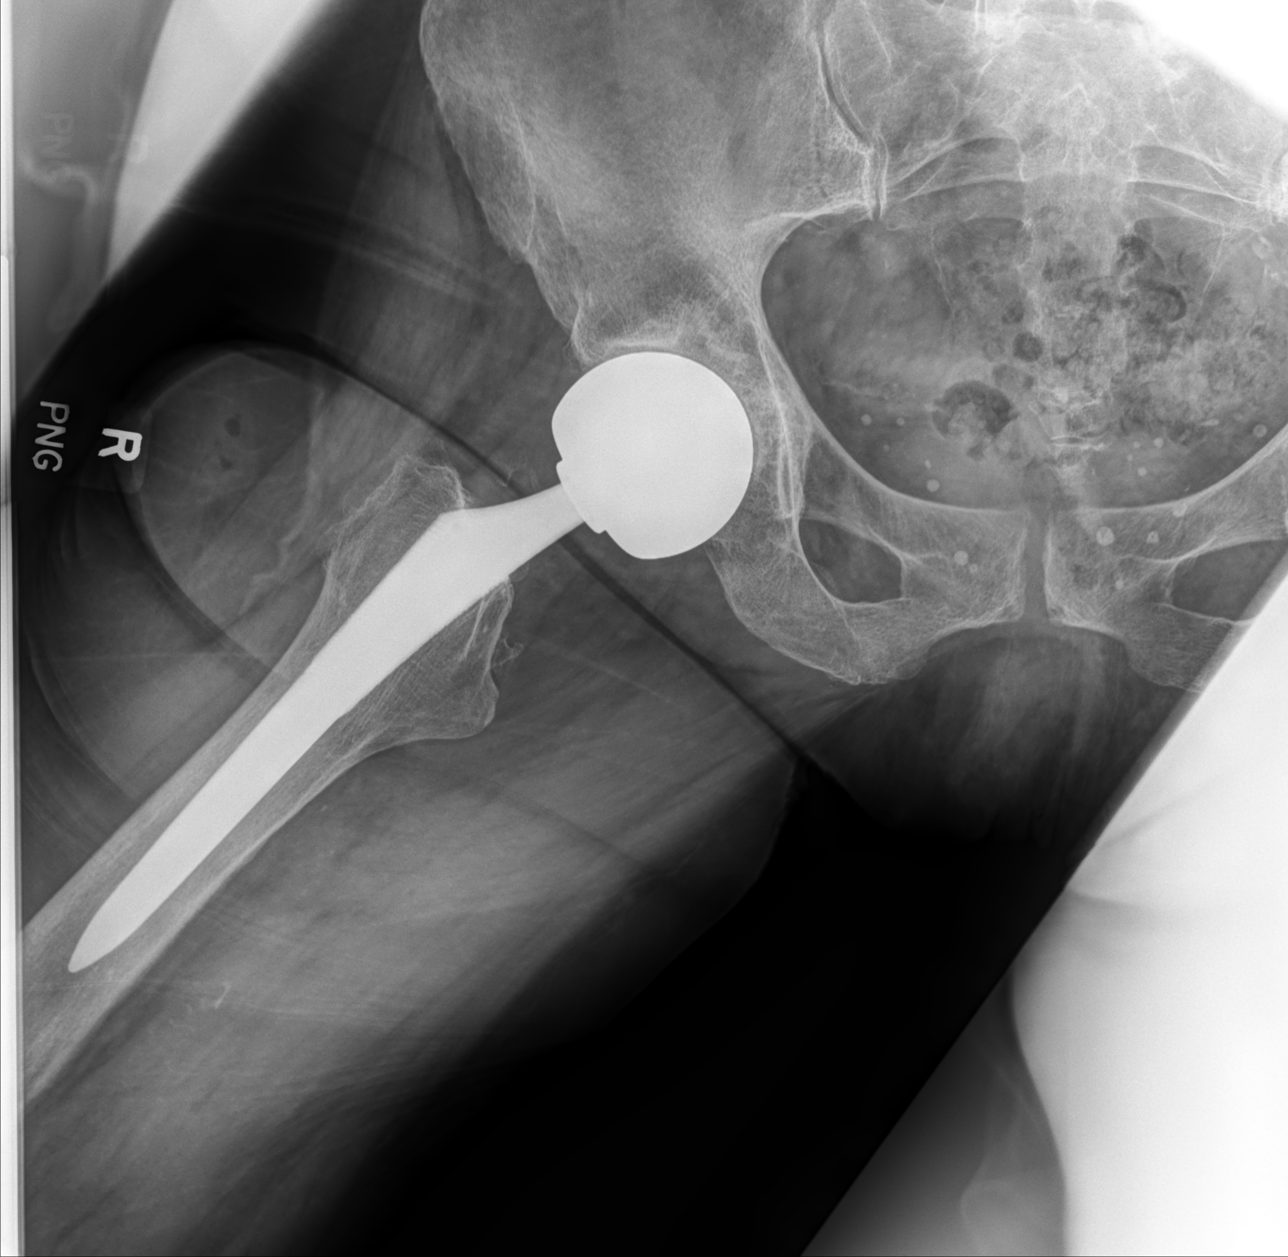

[3 of 3 positions shown; findings below may reference images not displayed]

FINDINGS: Bony pelvic ring intact. No acute displaced fracture. Right hip
arthroplasty again demonstrated. No marginal lucency. Heterotopic
ossification within the soft tissues, similar to prior.

Degenerative changes of the left hip.

Pelvic phleboliths.
IMPRESSION: No acute bony abnormality.

Surgical changes of right hip arthroplasty.

## 2020-06-14 ENCOUNTER — Other Ambulatory Visit: Payer: Self-pay

## 2020-06-14 ENCOUNTER — Ambulatory Visit (INDEPENDENT_AMBULATORY_CARE_PROVIDER_SITE_OTHER): Payer: Medicare HMO | Admitting: Family Medicine

## 2020-06-14 ENCOUNTER — Encounter: Payer: Self-pay | Admitting: Family Medicine

## 2020-06-14 DIAGNOSIS — M7061 Trochanteric bursitis, right hip: Secondary | ICD-10-CM | POA: Diagnosis not present

## 2020-06-14 NOTE — Progress Notes (Signed)
[name] [address] [place] [address] Phone: [phone] Subjective:   I Sheila [name] am serving as a Neurosurgeon for Dr. Antoine Primas.  This visit occurred during the SARS-CoV-2 public health emergency.  Safety protocols were in place, including screening questions prior to the visit, additional usage of staff PPE, and extensive cleaning of exam room while observing appropriate contact time as indicated for disinfecting solutions.   I'm seeing this patient by the request  of:  System, Pcp Not In  CC: Back pain follow-up  [id number]   04/28/2020 Chronic problem with exacerbation.  Repeat injection given today, tolerated the procedure well, discussed icing regimen and home exercise, which activities to do which wants to avoid.  Increase activity slowly.  Discussed icing regimen.  Follow-up again in  6 weeks.  Patient is responding well to the gabapentin that is helping with some of the radicular symptoms due to patient's other comorbidities and being on anticoagulation not a candidate for epidurals at the moment.  Follow-up again for 8 weeks  Update 06/14/2020 Sheila [name] is a 86 y.o. female coming in with complaint of SI joint and low back pain. Patient states she is doing well. Right hip and knee pain. Patient is seeing someone for the right knee. Wondering if she needs him injection today.  Patient states that the pain is more on the lateral aspect of the hip again at this time.  Does have knee replacement noted.  Patient denies any instability, states that the sacroiliac joint seems to be doing fairly well overall.       No past medical history on file. No past surgical history on file. Social History   Socioeconomic History   Marital status: Widowed    Spouse name: Not on file   Number of children: Not on file   Years of education: Not on file   Highest education level: Not on file  Occupational History   Not on file  Tobacco Use     Smoking status: Not on file  Substance and Sexual Activity   Alcohol use: Not on file   Drug use: Not on file   Sexual activity: Not on file  Other Topics Concern   Not on file  Social History Narrative   Not on file   Social Determinants of Health   Financial Resource Strain:    Difficulty of Paying Living Expenses: Not on file  Food Insecurity:    Worried About Running Out of Food in the Last Year: Not on file   Ran Out of Food in the Last Year: Not on file  Transportation Needs:    Lack of Transportation (Medical): Not on file   Lack of Transportation (Non-Medical): Not on file  Physical Activity:    Days of Exercise per Week: Not on file   Minutes of Exercise per Session: Not on file  Stress:    Feeling of Stress : Not on file  Social Connections:    Frequency of Communication with Friends and Family: Not on file   Frequency of Social Gatherings with Friends and Family: Not on file   Attends Religious Services: Not on file   Active Member of Clubs or Organizations: Not on file   Attends Banker Meetings: Not on file   Marital Status: Not on file   Allergies  Allergen Reactions   Latex    Penicillins Other (See Comments)   Statins    Clindamycin Rash   Other  Dermatitis and Other (See Comments)        No family history on file.      Current Outpatient Medications (Hematological):    clopidogrel (PLAVIX) 75 MG tablet, Take 75 mg by mouth daily.  Current Outpatient Medications (Other):    gabapentin (NEURONTIN) 100 MG capsule, Take 2 capsules (200 mg total) by mouth at bedtime.   gabapentin (NEURONTIN) 100 MG capsule, Take 2 capsules (200 mg total) by mouth at bedtime.   Reviewed prior external information including notes and imaging from  primary care provider As well as notes that were available from care everywhere and other healthcare systems.  Past medical history, social, surgical and family history all  reviewed in electronic medical record.  No pertanent information unless stated regarding to the chief complaint.   Review of Systems:  No headache, visual changes, nausea, vomiting, diarrhea, constipation, dizziness, abdominal pain, skin rash, fevers, chills, night sweats, weight loss, swollen lymph nodes, body aches, joint swelling, chest pain, shortness of breath, mood changes. POSITIVE muscle aches  Objective  Blood pressure 140/80, pulse 65, height 5\' 6"  (1.676 m), weight 150 lb (68 kg), SpO2 97 %.   General: No apparent distress alert and oriented x3 mood and affect normal, dressed appropriately.  HEENT: Pupils equal, extraocular movements intact   Gait is antalgic Low back exam has significant degenerative scoliosis noted.  Patient does have a positive FABER test on the right.  Patient is severely tender over the greater trochanteric area but still tender over the sacroiliac joint.  4-5 strength of the lower extremities.  After verbal consent patient was prepped with alcohol swab and with a 21-gauge 2 inch needle injected into the right greater trochanteric area of only 1 inch deep.  A total of 1 cc of 0.5% Marcaine and 1 cc of Kenalog 40 mg/mL used.  No blood loss.  Band-Aid placed.  Postinjection instructions given   Impression and Recommendations:     The above documentation has been reviewed and is accurate and complete , DO       Note: This dictation was prepared with Dragon dictation along with smaller phrase technology. Any transcriptional errors that result from this process are unintentional.

## 2020-06-14 NOTE — Assessment & Plan Note (Signed)
Repeat injection given today.  Patient has responded well to this as well as the sacroiliac joint.  Patient does have a history of the replacements of stage very superficial.  Patient seems to do better with this as well as the gabapentin at a low dose, this is a chronic problem with mild exacerbation.  Discussed icing regimen and home exercises.  Follow-up again 6 to 8 weeks

## 2020-06-14 NOTE — Patient Instructions (Addendum)
Injection today on the right hip  See me again 6-8 weeks

## 2020-07-26 ENCOUNTER — Other Ambulatory Visit: Payer: Self-pay

## 2020-07-26 ENCOUNTER — Ambulatory Visit (INDEPENDENT_AMBULATORY_CARE_PROVIDER_SITE_OTHER): Payer: Medicare HMO | Admitting: Family Medicine

## 2020-07-26 ENCOUNTER — Encounter: Payer: Self-pay | Admitting: Family Medicine

## 2020-07-26 DIAGNOSIS — M5136 Other intervertebral disc degeneration, lumbar region: Secondary | ICD-10-CM

## 2020-07-26 DIAGNOSIS — M7061 Trochanteric bursitis, right hip: Secondary | ICD-10-CM

## 2020-07-26 DIAGNOSIS — M47818 Spondylosis without myelopathy or radiculopathy, sacral and sacrococcygeal region: Secondary | ICD-10-CM

## 2020-07-26 NOTE — Assessment & Plan Note (Signed)
Stable but continues to have some discomfort and pain.

## 2020-07-26 NOTE — Progress Notes (Signed)
[name] [address] [place] [address] Phone: [phone] Subjective:    This visit occurred during the SARS-CoV-2 public health emergency.  Safety protocols were in place, including screening questions prior to the visit, additional usage of staff PPE, and extensive cleaning of exam room while observing appropriate contact time as indicated for disinfecting solutions.   Wynema Birch, am serving as a Neurosurgeon for Dr. Antoine Primas.  CC: Hip and back pain follow-up  [id number]    06/14/2020: Repeat injection given today.  Patient has responded well to this as well as the sacroiliac joint.  Patient does have a history of the replacements of stage very superficial.  Patient seems to do better with this as well as the gabapentin at a low dose, this is a chronic problem with mild exacerbation.  Discussed icing regimen and home exercises.  Follow-up again 6 to 8 weeks  Update: 07/26/2020 Keenan Dimitrov is a 86 y.o. female coming in with complaint of hip, back and knee pain. Patient states that her back has been hurting and is not sure about an injection wants to wait to see what your thought are.  Patient was given an injection in her right knee previously by another provider.Bluford Main like she has had more improvement when she has had the sacroiliac injections and would like 1 of those.    History reviewed. No pertinent past medical history. History reviewed. No pertinent surgical history. Social History   Socioeconomic History  . Marital status: Widowed    Spouse name: Not on file  . Number of children: Not on file  . Years of education: Not on file  . Highest education level: Not on file  Occupational History  . Not on file  Tobacco Use  . Smoking status: Not on file  Substance and Sexual Activity  . Alcohol use: Not on file  . Drug use: Not on file  . Sexual activity: Not on file  Other Topics Concern  . Not on file  Social History  Narrative  . Not on file   Social Determinants of Health   Financial Resource Strain:   . Difficulty of Paying Living Expenses: Not on file  Food Insecurity:   . Worried About Programme researcher, broadcasting/film/video in the Last Year: Not on file  . Ran Out of Food in the Last Year: Not on file  Transportation Needs:   . Lack of Transportation (Medical): Not on file  . Lack of Transportation (Non-Medical): Not on file  Physical Activity:   . Days of Exercise per Week: Not on file  . Minutes of Exercise per Session: Not on file  Stress:   . Feeling of Stress : Not on file  Social Connections:   . Frequency of Communication with Friends and Family: Not on file  . Frequency of Social Gatherings with Friends and Family: Not on file  . Attends Religious Services: Not on file  . Active Member of Clubs or Organizations: Not on file  . Attends Banker Meetings: Not on file  . Marital Status: Not on file   Allergies  Allergen Reactions  . Latex   . Penicillins Other (See Comments)  . Statins   . Clindamycin Rash  . Other Dermatitis and Other (See Comments)        History reviewed. No pertinent family history.      Current Outpatient Medications (Hematological):  .  clopidogrel (PLAVIX) 75 MG tablet, Take 75 mg  by mouth daily.  Current Outpatient Medications (Other):  .  gabapentin (NEURONTIN) 100 MG capsule, Take 2 capsules (200 mg total) by mouth at bedtime. .  gabapentin (NEURONTIN) 100 MG capsule, Take 2 capsules (200 mg total) by mouth at bedtime.   Reviewed prior external information including notes and imaging from  primary care provider As well as notes that were available from care everywhere and other healthcare systems.  Past medical history, social, surgical and family history all reviewed in electronic medical record.  No pertanent information unless stated regarding to the chief complaint.   Review of Systems:  No headache, visual changes, nausea, vomiting,  diarrhea, constipation, dizziness, abdominal pain, skin rash, fevers, chills, night sweats, weight loss, swollen lymph nodes, body aches, joint swelling, chest pain, shortness of breath, mood changes. POSITIVE muscle aches  Objective  Blood pressure 100/60, pulse 69, height 5\' 6"  (1.676 m), weight 148 lb (67.1 kg), SpO2 97 %.   General: No apparent distress alert and oriented x3 mood and affect normal, dressed appropriately.  HEENT: Pupils equal, extraocular movements intact  Respiratory: Patient's speak in full sentences and does not appear short of breath  Cardiovascular: No lower extremity edema, non tender, no erythema  Severe antalgic gait. Patient has excessive kyphosis of the upper thoracic spine and does have scoliosis of the lumbar spine.  Severe tenderness over the right sacroiliac joint.  Difficulty even to do secondary to pain in some of my patients mild weakness.  Diffuse tenderness also noted in the paraspinal musculature of the lumbar spine.  Mild leg atrophy bilaterally but symmetric strength  After verbal consent patient was prepped with alcohol swabs and with a 21-gauge 2 inch needle injected into the vicinity of the right sacroiliac joint with a total of 2 cc of 0.5% Marcaine and 1 cc of Kenalog 40 mg/mL.  No blood loss.  Postinjection instructions given   Impression and Recommendations:     The above documentation has been reviewed and is accurate and complete Pearlean Brownie, DO

## 2020-07-26 NOTE — Assessment & Plan Note (Addendum)
Patient given injection today and tolerated the procedure well.  Hoping that this will not be beneficial again.  Patient has now had three in this joint.  Patient has had years of back issues and I do think there is some lumbar radiculopathy.  Encouraged her to continue to try the gabapentin.  Patient's hip x-rays previously did show that the arthroplasty seems to be in place.  Would need further work-up of the back at follow-up if continuing to have difficulty.  Did discuss with patient as well as her granddaughter who is at bedside that this is never going to be perfect with the steroid and disoriented only helps with some of the pain they understand and understand the risks involved.

## 2020-07-26 NOTE — Assessment & Plan Note (Signed)
Encourage her to follow-up with her pain management to discuss potential epidurals again.  Otherwise I would consider advanced imaging of the spine again.

## 2020-07-26 NOTE — Patient Instructions (Addendum)
Good to see you See me again in 6-8 weeks 

## 2020-08-31 ENCOUNTER — Other Ambulatory Visit: Payer: Self-pay | Admitting: Family Medicine

## 2020-09-04 MED ORDER — GABAPENTIN 100 MG PO CAPS: 200.0000 mg | ORAL_CAPSULE | Freq: Every day | ORAL | 0 refills | Status: DC

## 2020-09-13 ENCOUNTER — Ambulatory Visit: Payer: Medicare HMO | Admitting: Family Medicine

## 2020-10-26 ENCOUNTER — Ambulatory Visit: Payer: Medicare HMO | Admitting: Family Medicine

## 2020-12-05 ENCOUNTER — Other Ambulatory Visit: Payer: Self-pay | Admitting: Family Medicine

## 2020-12-05 MED ORDER — GABAPENTIN 100 MG PO CAPS: 200.0000 mg | ORAL_CAPSULE | Freq: Every day | ORAL | 0 refills | Status: DC

## 2020-12-08 ENCOUNTER — Ambulatory Visit: Payer: Medicare HMO | Admitting: Family Medicine

## 2021-01-18 ENCOUNTER — Other Ambulatory Visit: Payer: Self-pay | Admitting: Family Medicine

## 2021-01-25 ENCOUNTER — Ambulatory Visit: Payer: Medicare HMO | Admitting: Family Medicine

## 2021-03-27 ENCOUNTER — Ambulatory Visit: Payer: Medicare HMO | Admitting: Family Medicine

## 2024-04-10 ENCOUNTER — Encounter (HOSPITAL_COMMUNITY): Payer: Self-pay | Admitting: *Deleted

## 2024-04-10 ENCOUNTER — Other Ambulatory Visit: Payer: Self-pay

## 2024-04-10 ENCOUNTER — Emergency Department (HOSPITAL_COMMUNITY)
Admission: EM | Admit: 2024-04-10 | Discharge: 2024-04-10 | Disposition: A | Discharge status: Home / Self Care | Attending: Emergency Medicine | Admitting: Emergency Medicine

## 2024-04-10 DIAGNOSIS — R112 Nausea with vomiting, unspecified: Secondary | ICD-10-CM | POA: Diagnosis not present

## 2024-04-10 DIAGNOSIS — R531 Weakness: Secondary | ICD-10-CM | POA: Diagnosis present

## 2024-04-10 DIAGNOSIS — Z7902 Long term (current) use of antithrombotics/antiplatelets: Secondary | ICD-10-CM | POA: Insufficient documentation

## 2024-04-10 DIAGNOSIS — R197 Diarrhea, unspecified: Secondary | ICD-10-CM | POA: Diagnosis not present

## 2024-04-10 DIAGNOSIS — R109 Unspecified abdominal pain: Secondary | ICD-10-CM | POA: Insufficient documentation

## 2024-04-10 DIAGNOSIS — Z91040 Latex allergy status: Secondary | ICD-10-CM | POA: Diagnosis not present

## 2024-04-10 DIAGNOSIS — R42 Dizziness and giddiness: Secondary | ICD-10-CM | POA: Insufficient documentation

## 2024-04-10 DIAGNOSIS — T675XXA Heat exhaustion, unspecified, initial encounter: Secondary | ICD-10-CM

## 2024-04-10 HISTORY — DX: Other forms of angina pectoris: I20.89

## 2024-04-10 HISTORY — DX: Transient cerebral ischemic attack, unspecified: G45.9

## 2024-04-10 HISTORY — DX: Other problems related to education and literacy: Z55.8

## 2024-04-10 LAB — CBC
HCT: 35.4 % — ABNORMAL LOW (ref 36.0–46.0)
Hemoglobin: 11.3 g/dL — ABNORMAL LOW (ref 12.0–15.0)
MCH: 27.7 pg (ref 26.0–34.0)
MCHC: 31.9 g/dL (ref 30.0–36.0)
MCV: 86.8 fL (ref 80.0–100.0)
Platelets: 269 K/uL (ref 150–400)
RBC: 4.08 MIL/uL (ref 3.87–5.11)
RDW: 15.7 % — ABNORMAL HIGH (ref 11.5–15.5)
WBC: 8.8 K/uL (ref 4.0–10.5)
nRBC: 0 % (ref 0.0–0.2)

## 2024-04-10 LAB — COMPREHENSIVE METABOLIC PANEL WITH GFR
ALT: 13 U/L (ref 0–44)
AST: 22 U/L (ref 15–41)
Albumin: 3.7 g/dL (ref 3.5–5.0)
Alkaline Phosphatase: 99 U/L (ref 38–126)
Anion gap: 11 (ref 5–15)
BUN: 16 mg/dL (ref 8–23)
CO2: 20 mmol/L — ABNORMAL LOW (ref 22–32)
Calcium: 10 mg/dL (ref 8.9–10.3)
Chloride: 105 mmol/L (ref 98–111)
Creatinine, Ser: 1.02 mg/dL — ABNORMAL HIGH (ref 0.44–1.00)
GFR, Estimated: 53 mL/min — ABNORMAL LOW (ref >60)
Glucose, Bld: 114 mg/dL — ABNORMAL HIGH (ref 70–99)
Potassium: 4 mmol/L (ref 3.5–5.1)
Sodium: 136 mmol/L (ref 135–145)
Total Bilirubin: 0.9 mg/dL (ref 0.0–1.2)
Total Protein: 6.4 g/dL — ABNORMAL LOW (ref 6.5–8.1)

## 2024-04-10 LAB — TROPONIN I (HIGH SENSITIVITY): Troponin I (High Sensitivity): 6 ng/L (ref <18)

## 2024-04-10 LAB — LIPASE, BLOOD: Lipase: 30 U/L (ref 11–51)

## 2024-04-10 MED ORDER — SODIUM CHLORIDE 0.9 % IV BOLUS
500.0000 mL | Freq: Once | INTRAVENOUS | Status: AC
  Administered 2024-04-10: 500 mL via INTRAVENOUS

## 2024-04-10 MED ORDER — LOPERAMIDE HCL 2 MG PO CAPS
4.0000 mg | ORAL_CAPSULE | Freq: Once | ORAL | Status: AC
Start: 2024-04-10 — End: 2024-04-10
  Administered 2024-04-10: 4 mg via ORAL
  Filled 2024-04-10: qty 2

## 2024-04-10 NOTE — ED Triage Notes (Signed)
 Abd pain with nausea vomiting  low grade temp the family wants an EKG  she lives alone   diarrhea today

## 2024-04-10 NOTE — ED Provider Notes (Signed)
 [facility] EMERGENCY DEPARTMENT AT [facility] Provider Note   CSN: [id number] Arrival date & time: 04/10/24  1813     Patient presents with: Weakness and Abdominal Pain   Sheila [name] is a 89 y.o. female who presents to the ED today with concerns for generalized weakness that is preceded by sudden onset of nausea with 2 episodes of vomiting and multiple episodes of loose stools.  She denies having any bloody stools, denies any signs of blood in the vomit.  She required assistance by family members to clean herself up, they took her temperature which was noted to be elevated at 100.4 F.  She was provided with oral hydration by family members, and brought to the hospital for further evaluation.  She now states that she feels more at her baseline, endorsing that she feels like she could stand up without difficulty.  It is notable that she was in an outdoor environment for a substantial part of the evening, subject to heat exposure.    Weakness Associated symptoms: abdominal pain, diarrhea, dizziness and nausea   Abdominal Pain Associated symptoms: diarrhea and nausea        Prior to Admission medications   Medication Sig Start Date End Date Taking? Authorizing Provider  clopidogrel (PLAVIX) 75 MG tablet Take 75 mg by mouth daily.    [provider]  gabapentin  (NEURONTIN ) 100 MG capsule Take 2 capsules (200 mg total) by mouth at bedtime. 09/16/19   Claudene Arthea HERO, DO  gabapentin  (NEURONTIN ) 100 MG capsule TAKE 2 CAPSULES BY MOUTH AT BEDTIME. 01/18/21   Smith, Zachary M, DO    Allergies: Latex, Penicillins, Statins, Clindamycin, and Other    Review of Systems  Gastrointestinal:  Positive for abdominal pain, diarrhea and nausea.  Neurological:  Positive for dizziness and weakness.  All other systems reviewed and are negative.   Updated Vital Signs BP 138/83 (BP Location: Right Arm)   Pulse 96   Temp 99 F (37.2 C)   Resp 20   Ht 5' 6 (1.676 m)   Wt 67.1  kg   SpO2 95%   BMI 23.88 kg/m   Physical Exam Vitals and nursing note reviewed.  Constitutional:      General: She is not in acute distress.    Appearance: Normal appearance.  HENT:     Head: Normocephalic and atraumatic.     Mouth/Throat:     Mouth: Mucous membranes are moist.     Pharynx: Oropharynx is clear.  Eyes:     Extraocular Movements: Extraocular movements intact.     Conjunctiva/sclera: Conjunctivae normal.     Pupils: Pupils are equal, round, and reactive to light.  Cardiovascular:     Rate and Rhythm: Normal rate and regular rhythm.     Pulses: Normal pulses.     Heart sounds: Normal heart sounds. No murmur heard.    No friction rub. No gallop.  Pulmonary:     Effort: Pulmonary effort is normal.     Breath sounds: Normal breath sounds.  Abdominal:     General: Abdomen is flat. Bowel sounds are normal.     Palpations: Abdomen is soft.     Tenderness: There is no abdominal tenderness.  Musculoskeletal:        General: Normal range of motion.     Cervical back: Normal range of motion and neck supple.     Right lower leg: No edema.     Left lower leg: No edema.  Skin:  General: Skin is warm and dry.     Capillary Refill: Capillary refill takes less than 2 seconds.  Neurological:     General: No focal deficit present.     Mental Status: She is alert. Mental status is at baseline.  Psychiatric:        Mood and Affect: Mood normal.        Behavior: Behavior is cooperative.     (all labs ordered are listed, but only abnormal results are displayed) Labs Reviewed  CBC - Abnormal; Notable for the following components:      Result Value   Hemoglobin 11.3 (*)    HCT 35.4 (*)    RDW 15.7 (*)    All other components within normal limits  LIPASE, BLOOD  COMPREHENSIVE METABOLIC PANEL WITH GFR  URINALYSIS, ROUTINE W REFLEX MICROSCOPIC  TROPONIN I (HIGH SENSITIVITY)    EKG: None  Radiology: No results found.   Procedures   Medications Ordered in the  ED - No data to display                                  Medical Decision Making  Medical Decision Making:   Sheila [name] is a 89 y.o. female who presented to the ED today with weakness along with previous episodes of nausea and vomiting and diarrhea detailed above.    Additional history discussed with patient's family/caregivers.  Complete initial physical exam performed, notably the patient  was alert and oriented in no apparent distress.  Skin turgor is normal, oral mucosa moist.  Physical exam is largely unremarkable..    Reviewed and confirmed nursing documentation for past medical history, family history, social history.    Initial Assessment:   With the patient's presentation of generalized weakness, most likely diagnosis is heat exposure.  Acute arrhythmia, acute neurovascular insult are also differential diagnoses.  Also further differential considered of gastroenteritis.   Initial Plan:  Based on physical exam, imaging is deferred at this time. Screening labs including CBC and Metabolic panel to evaluate for infectious or metabolic etiology of disease.  Urinalysis with reflex culture ordered to evaluate for UTI or relevant urologic/nephrologic pathology.  EKG to evaluate for cardiac pathology Objective evaluation as below reviewed   Initial Study Results:   Laboratory  All laboratory results reviewed without evidence of clinically relevant pathology.   Exceptions include: Hemoglobin is 11.3 which is stable compared to outpatient results from 2024.  EKG EKG was reviewed independently. Rate, rhythm, axis, intervals all examined and without medically relevant abnormality. ST segments without concerns for elevations.     Reassessment and Plan:   On reevaluation of the patient, after fluid bolus of 500 cc of normal saline, she is subjectively improved.  Will refer to primary care for follow-up on her lab work, reevaluation in the next 2 weeks.  Otherwise recommend increase  oral fluids, rest, and avoidance of prolonged heat exposure in the future.       Final diagnoses:  None    ED Discharge Orders     None          Myriam Dorn BROCKS, [place] 04/10/24 2122    Armenta Canning, MD 04/16/24 2129

## 2024-04-10 NOTE — ED Notes (Signed)
 Pt ambulated to bathroom with assistance. Pt had steady gait and no complaints, unable to provide urine
